# Patient Record
Sex: Male | Born: 1990 | Race: White | Hispanic: No | Marital: Single | Smoking: Never smoker
Health system: Southern US, Community
[De-identification: ages and names within clinical notes are randomized; demographics above are authoritative.]

---
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
## 2016-11-14 ENCOUNTER — Ambulatory Visit
Admission: EM | Admit: 2016-11-14 | Discharge: 2016-11-14 | Disposition: A | Discharge status: Home / Self Care | Payer: BC Managed Care – PPO | Attending: Emergency Medicine | Admitting: Emergency Medicine

## 2016-11-14 DIAGNOSIS — K047 Periapical abscess without sinus: Secondary | ICD-10-CM

## 2016-11-14 DIAGNOSIS — S025XXB Fracture of tooth (traumatic), initial encounter for open fracture: Secondary | ICD-10-CM | POA: Diagnosis not present

## 2016-11-14 MED ORDER — AMOXICILLIN-POT CLAVULANATE 875-125 MG PO TABS: 1.0000 | ORAL_TABLET | Freq: Two times a day (BID) | ORAL | 0 refills | Status: DC

## 2016-11-14 NOTE — ED Provider Notes (Signed)
CSN: [id number]     Arrival date & time 11/14/16  1345 History   First MD Initiated Contact with Patient 11/14/16 1521     Chief Complaint  Patient presents with  . Dental Pain   (Consider location/radiation/quality/duration/timing/severity/associated sxs/prior Treatment) HPI  26 year old male who presents with history of tooth pain and swollen jaw. He has a fractured tooth on the bottom left #19. His face is swollen on the left. There is no cervical adenopathy. He has a dental appointment next week but they will not do anything until the swelling is subsided and infection is under control. he does not require pain medications.       History reviewed. No pertinent past medical history. History reviewed. No pertinent surgical history. History reviewed. No pertinent family history. Social History  Substance Use Topics  . Smoking status: Never Smoker  . Smokeless tobacco: Never Used  . Alcohol use No    Review of Systems  Constitutional: Negative for activity change, chills, fatigue and fever.  HENT: Positive for dental problem.   All other systems reviewed and are negative.   Allergies  Patient has no known allergies.  Home Medications   Prior to Admission medications   Medication Sig Start Date End Date Taking? Authorizing Provider  amoxicillin-clavulanate (AUGMENTIN) 875-125 MG tablet Take 1 tablet by mouth every 12 (twelve) hours. 11/14/16   Lutricia Feil, PA-C   Meds Ordered and Administered this Visit  Medications - No data to display  BP (!) 147/77 (BP Location: Left Arm)   Pulse (!) 59   Temp 98.5 F (36.9 C) (Oral)   Resp 18   Ht 6' (1.829 m)   Wt 260 lb (117.9 kg)   SpO2 99%   BMI 35.26 kg/m  No data found.   Physical Exam  Constitutional: He is oriented to person, place, and time. He appears well-developed and well-nourished. No distress.  HENT:  Head: Normocephalic and atraumatic.  Examination shows the face to be swollen on the left side. No  induration or fluctuance present. #19 is fractured. The gums surrounding that area is swollen tender and erythematous.  No Purulent is seen.  Eyes: Pupils are equal, round, and reactive to light.  Neck: Normal range of motion. Neck supple.  Musculoskeletal: Normal range of motion.  Lymphadenopathy:    He has no cervical adenopathy.  Neurological: He is alert and oriented to person, place, and time.  Skin: Skin is warm and dry. He is not diaphoretic.  Psychiatric: He has a normal mood and affect. His behavior is normal. Judgment and thought content normal.  Nursing note and vitals reviewed.   Urgent Care Course     Procedures (including critical care time)  Labs Review Labs Reviewed - No data to display  Imaging Review No results found.   Visual Acuity Review  Right Eye Distance:   Left Eye Distance:   Bilateral Distance:    Right Eye Near:   Left Eye Near:    Bilateral Near:         MDM   1. Open fracture of tooth, initial encounter   2. Dental abscess    Discharge Medication List as of 11/14/2016  3:30 PM    START taking these medications   Details  amoxicillin-clavulanate (AUGMENTIN) 875-125 MG tablet Take 1 tablet by mouth every 12 (twelve) hours., Starting Sun 11/14/2016, Normal      Plan: 1. Test/x-ray results and diagnosis reviewed with patient 2. rx as per orders; risks, benefits, potential  side effects reviewed with patient 3. Recommend supportive treatment with warm Compresses to the jaw times daily for 10 minutes at a time.Follow up with his dentist next week. Use a combination of Motrin and Tylenol together every 6 hours for pain control 4. F/u prn if symptoms worsen or don't improve     Lutricia Feil, PA-C 11/14/16 1538    [address] [place], [place] 11/14/16 1538

## 2016-11-14 NOTE — Discharge Instructions (Signed)
Use ibuprofen 400 mg with Tylenol 500 mg every 6 hours as necessary for dental pain. Take both pills together at one time

## 2021-01-08 ENCOUNTER — Emergency Department
Admission: EM | Admit: 2021-01-08 | Discharge: 2021-01-08 | Disposition: A | Discharge status: Home / Self Care | Payer: Managed Care, Other (non HMO) | Attending: Emergency Medicine | Admitting: Emergency Medicine

## 2021-01-08 ENCOUNTER — Encounter: Payer: Self-pay | Admitting: Emergency Medicine

## 2021-01-08 ENCOUNTER — Emergency Department: Payer: Managed Care, Other (non HMO)

## 2021-01-08 ENCOUNTER — Other Ambulatory Visit: Payer: Self-pay

## 2021-01-08 DIAGNOSIS — Y9389 Activity, other specified: Secondary | ICD-10-CM | POA: Diagnosis not present

## 2021-01-08 DIAGNOSIS — Y990 Civilian activity done for income or pay: Secondary | ICD-10-CM | POA: Diagnosis not present

## 2021-01-08 DIAGNOSIS — S91312A Laceration without foreign body, left foot, initial encounter: Secondary | ICD-10-CM | POA: Diagnosis not present

## 2021-01-08 DIAGNOSIS — Z23 Encounter for immunization: Secondary | ICD-10-CM | POA: Diagnosis not present

## 2021-01-08 DIAGNOSIS — W208XXA Other cause of strike by thrown, projected or falling object, initial encounter: Secondary | ICD-10-CM | POA: Diagnosis not present

## 2021-01-08 DIAGNOSIS — S99922A Unspecified injury of left foot, initial encounter: Secondary | ICD-10-CM | POA: Diagnosis present

## 2021-01-08 DIAGNOSIS — Y9289 Other specified places as the place of occurrence of the external cause: Secondary | ICD-10-CM | POA: Insufficient documentation

## 2021-01-08 MED ORDER — CEPHALEXIN 500 MG PO CAPS: 1000.0000 mg | ORAL_CAPSULE | Freq: Two times a day (BID) | ORAL | 0 refills | Status: DC

## 2021-01-08 MED ORDER — TETANUS-DIPHTH-ACELL PERTUSSIS 5-2.5-18.5 LF-MCG/0.5 IM SUSY
0.5000 mL | PREFILLED_SYRINGE | Freq: Once | INTRAMUSCULAR | Status: AC
  Administered 2021-01-08: 0.5 mL via INTRAMUSCULAR
  Filled 2021-01-08: qty 0.5

## 2021-01-08 MED ORDER — LIDOCAINE HCL (PF) 1 % IJ SOLN
10.0000 mL | Freq: Once | INTRAMUSCULAR | Status: AC
  Administered 2021-01-08: 10 mL
  Filled 2021-01-08: qty 10

## 2021-01-08 NOTE — ED Provider Notes (Signed)
[facility] Emergency Department Provider Note  ____________________________________________  Time seen: Approximately 9:52 PM  I have reviewed the triage vital signs and the nursing notes.   HISTORY  Chief Complaint Laceration    HPI Jacob Zuniga is a 30 y.o. male who presents the emergency department for a laceration to the left foot.  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  Patient sustained a laceration through his tennis shoe and arrives with laceration to the dorsal aspect of the left foot.  No loss of range of motion.  Patient is still ambulatory at this time.  Last tetanus shot is roughly greater than 5 years ago.  No medications prior to arrival.  No other injury or complaint at this time.         History reviewed. No pertinent past medical history.  There are no problems to display for this patient.   History reviewed. No pertinent surgical history.  Prior to Admission medications   Medication Sig Start Date End Date Taking? Authorizing Provider  cephALEXin (KEFLEX) 500 MG capsule Take 2 capsules (1,000 mg total) by mouth 2 (two) times daily. 01/08/21  Yes Pharoah Goggins, Delorise Royals, PA-C  amoxicillin-clavulanate (AUGMENTIN) 875-125 MG tablet Take 1 tablet by mouth every 12 (twelve) hours. 11/14/16   Lutricia Feil, PA-C    Allergies Patient has no known allergies.  No family history on file.  Social History Social History   Tobacco Use  . Smoking status: Never Smoker  . Smokeless tobacco: Never Used  Substance Use Topics  . Alcohol use: No  . Drug use: No     Review of Systems  Constitutional: No fever/chills Eyes: No visual changes. No discharge ENT: No upper respiratory complaints. Cardiovascular: no chest pain. Respiratory: no cough. No SOB. Gastrointestinal: No abdominal pain.  No nausea, no vomiting.  No  diarrhea.  No constipation. Musculoskeletal: Positive for laceration to the left foot Skin: Negative for rash, abrasions, lacerations, ecchymosis. Neurological: Negative for headaches, focal weakness or numbness.  10 System ROS otherwise negative.  ____________________________________________   PHYSICAL EXAM:  VITAL SIGNS: ED Triage Vitals [01/08/21 2024]  Enc Vitals Group     BP (!) 147/109     Pulse Rate 81     Resp 20     Temp 98.4 F (36.9 C)     Temp Source Oral     SpO2 97 %     Weight 259 lb 14.8 oz (117.9 kg)     Height 6' (1.829 m)     Head Circumference      Peak Flow      Pain Score 8     Pain Loc      Pain Edu?      Excl. in GC?      Constitutional: Alert and oriented. Well appearing and in no acute distress. Eyes: Conjunctivae are normal. PERRL. EOMI. Head: Atraumatic. ENT:      Ears:       Nose: No congestion/rhinnorhea.      Mouth/Throat: Mucous membranes are moist.  Neck: No stridor.    Cardiovascular: Normal rate, regular rhythm. Normal S1 and S2.  Good peripheral circulation. Respiratory: Normal respiratory effort without tachypnea or retractions. Lungs CTAB. Good air entry to the bases with no decreased or absent breath sounds. Musculoskeletal: Full range of motion to all extremities. No gross deformities appreciated.  Visualization of the left foot reveals a "L" shaped laceration measuring approximately 2.5 cm in length.  Patient with minimal amount of bleeding at this time.  No visible foreign body.  Full range of motion to the foot and all digits.  Sensation intact all digits.  Capillary refill less than 2 seconds all digits.  This occurs over the midfoot. Neurologic:  Normal speech and language. No gross focal neurologic deficits are appreciated.  Skin:  Skin is warm, dry and intact. No rash noted. Psychiatric: Mood and affect are normal. Speech and behavior are normal. Patient exhibits appropriate insight and  judgement.   ____________________________________________   LABS (all labs ordered are listed, but only abnormal results are displayed)  Labs Reviewed - No data to display ____________________________________________  EKG   ____________________________________________  RADIOLOGY I personally viewed and evaluated these images as part of my medical decision making, as well as reviewing the written report by the radiologist.  ED Provider Interpretation: No evidence of osseous trauma or retained foreign body  DG Foot Complete Left  Result Date: 01/08/2021 CLINICAL DATA:  Dropped heavy object on foot, laceration EXAM: LEFT FOOT - COMPLETE 3+ VIEW COMPARISON:  None. FINDINGS: Frontal, oblique, and lateral views of the left foot are obtained. No acute fracture, subluxation, or dislocation. Joint spaces are well preserved. There is mild dorsal soft tissue swelling of the forefoot. No radiopaque foreign body. IMPRESSION: 1. Dorsal soft tissue swelling of the forefoot. No underlying fracture. Electronically Signed   By: Sharlet Salina M.D.   On: 01/08/2021 20:56    ____________________________________________    PROCEDURES  Procedure(s) performed:    Marland KitchenMarland KitchenLaceration Repair  Date/Time: 01/08/2021 10:58 PM Performed by: Racheal Patches, PA-C Authorized by: Racheal Patches, PA-C   Consent:    Consent obtained:  Verbal   Consent given by:  Patient   Risks discussed:  Infection, pain and poor wound healing Universal protocol:    Procedure explained and questions answered to patient or proxy's satisfaction: yes     Immediately prior to procedure, a time out was called: yes     Patient identity confirmed:  Verbally with patient Anesthesia:    Anesthesia method:  Local infiltration   Local anesthetic:  Lidocaine 1% w/o epi Laceration details:    Location:  Foot   Foot location:  Top of L foot   Length (cm):  2.5 Pre-procedure details:    Preparation:  Patient was prepped  and draped in usual sterile fashion and imaging obtained to evaluate for foreign bodies Exploration:    Hemostasis achieved with:  Direct pressure   Imaging obtained: x-ray     Imaging outcome: foreign body not noted     Wound exploration: wound explored through full range of motion and entire depth of wound visualized     Wound extent: no foreign bodies/material noted, no nerve damage noted, no tendon damage noted, no underlying fracture noted and no vascular damage noted     Contaminated: no   Treatment:    Area cleansed with:  Povidone-iodine and saline   Amount of cleaning:  Standard   Irrigation solution:  Sterile saline   Irrigation volume:    Irrigation method:  Syringe Skin repair:    Repair method:  Sutures   Suture size:  4-0   Suture material:  Nylon   Suture technique:  Simple interrupted   Number of sutures:  5 Approximation:    Approximation:  Close Repair type:    Repair type:  Simple Post-procedure details:    Dressing:  Sterile dressing   Procedure completion:  Tolerated well, no immediate complications      Medications  lidocaine (PF) (XYLOCAINE) 1 % injection 10 mL (has no administration in time range)  Tdap (BOOSTRIX) injection 0.5 mL (has no administration in time range)     ____________________________________________   INITIAL IMPRESSION / ASSESSMENT AND PLAN / ED COURSE  Pertinent labs & imaging results that were available during my care of the patient were reviewed by me and considered in my medical decision making (see chart for details).  Review of the [place] CSRS was performed in accordance of the [facility] prior to dispensing any controlled drugs.           Patient's diagnosis is consistent with foot laceration.  Patient presented to the emergency department complaining of a laceration to the left foot.  Patient dropped a large compressor onto his foot and sustained a laceration through the top of his shoe and foot.  No visible foreign  body on exam.  No radiopaque foreign body on imaging.  No underlying osseous trauma.  Full range of motion, sensation and capillary refill to the digits of the left foot.  Laceration is closed as described above.  Patient tolerated well with no complications.  Wound care instructions discussed with the patient.  Patiently placed on antibiotics prophylactically.  Tetanus shot updated tonight.  Return to urgent care, primary care in 7 to 10 days for suture removal. Patient is given ED precautions to return to the ED for any worsening or new symptoms.     ____________________________________________  FINAL CLINICAL IMPRESSION(S) / ED DIAGNOSES  Final diagnoses:  Laceration of left foot, initial encounter      NEW MEDICATIONS STARTED DURING THIS VISIT:  ED Discharge Orders         Ordered    cephALEXin (KEFLEX) 500 MG capsule  2 times daily        01/08/21 2301              This chart was dictated using voice recognition software/Dragon. Despite best efforts to proofread, errors can occur which can change the meaning. Any change was purely unintentional.    Racheal Patches, PA-C 01/08/21 2301    Delton Prairie, MD 01/08/21 [phone]

## 2021-01-08 NOTE — ED Triage Notes (Signed)
FIRST NURSE NOTE:  Pt here with L foot injury after dropping a compressor on L foot.

## 2021-01-08 NOTE — ED Triage Notes (Signed)
Pt reports that he was moving stuff and dropped something heavy on his left foot and has a laceration on the top of his foot. Bleeding is under control.

## 2021-09-12 IMAGING — DX DG FOOT COMPLETE 3+V*L*
3 series · 3 of 3 positions shown · non-contrast
Comparison: None.

CLINICAL DATA: Dropped heavy object on foot, laceration

EXAM:
LEFT FOOT - COMPLETE 3+ VIEW

[foot ap]
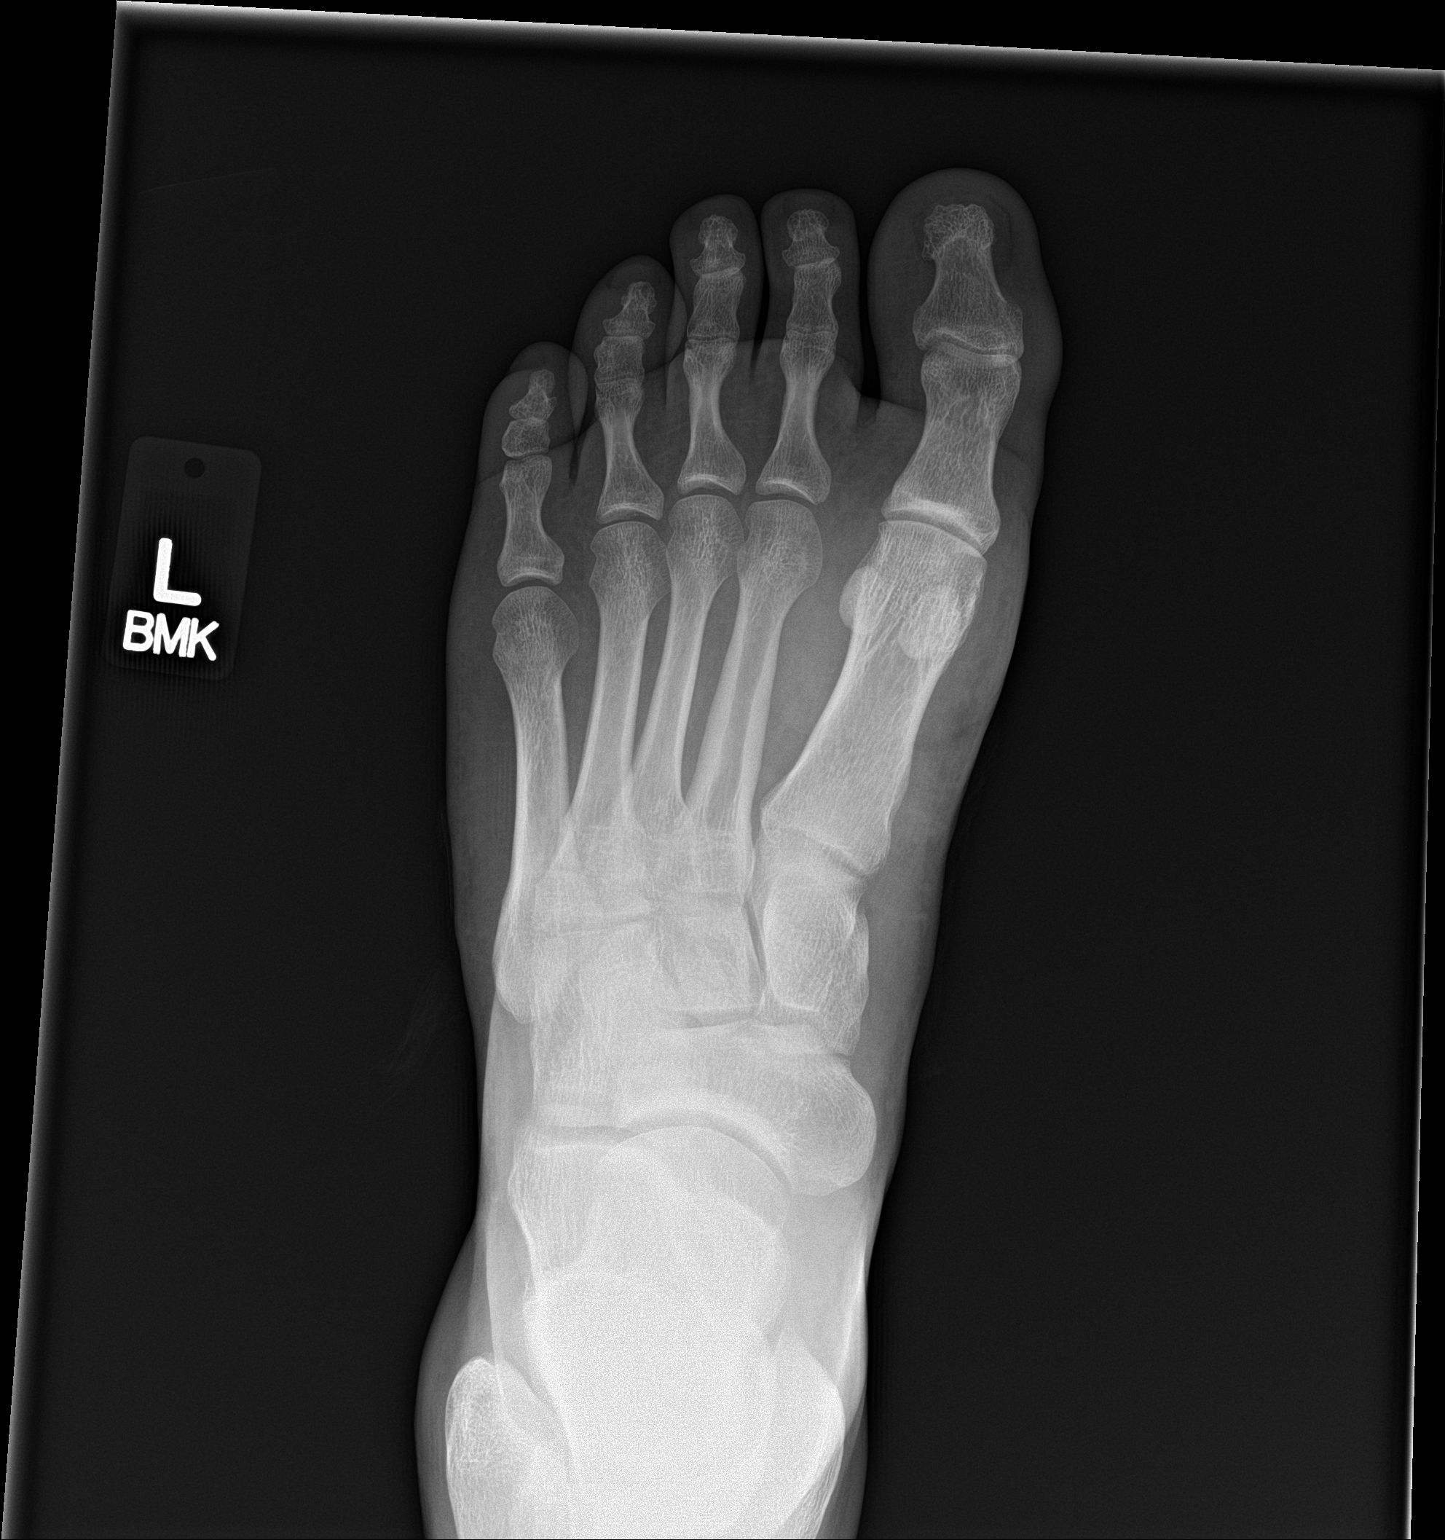

[foot obl]
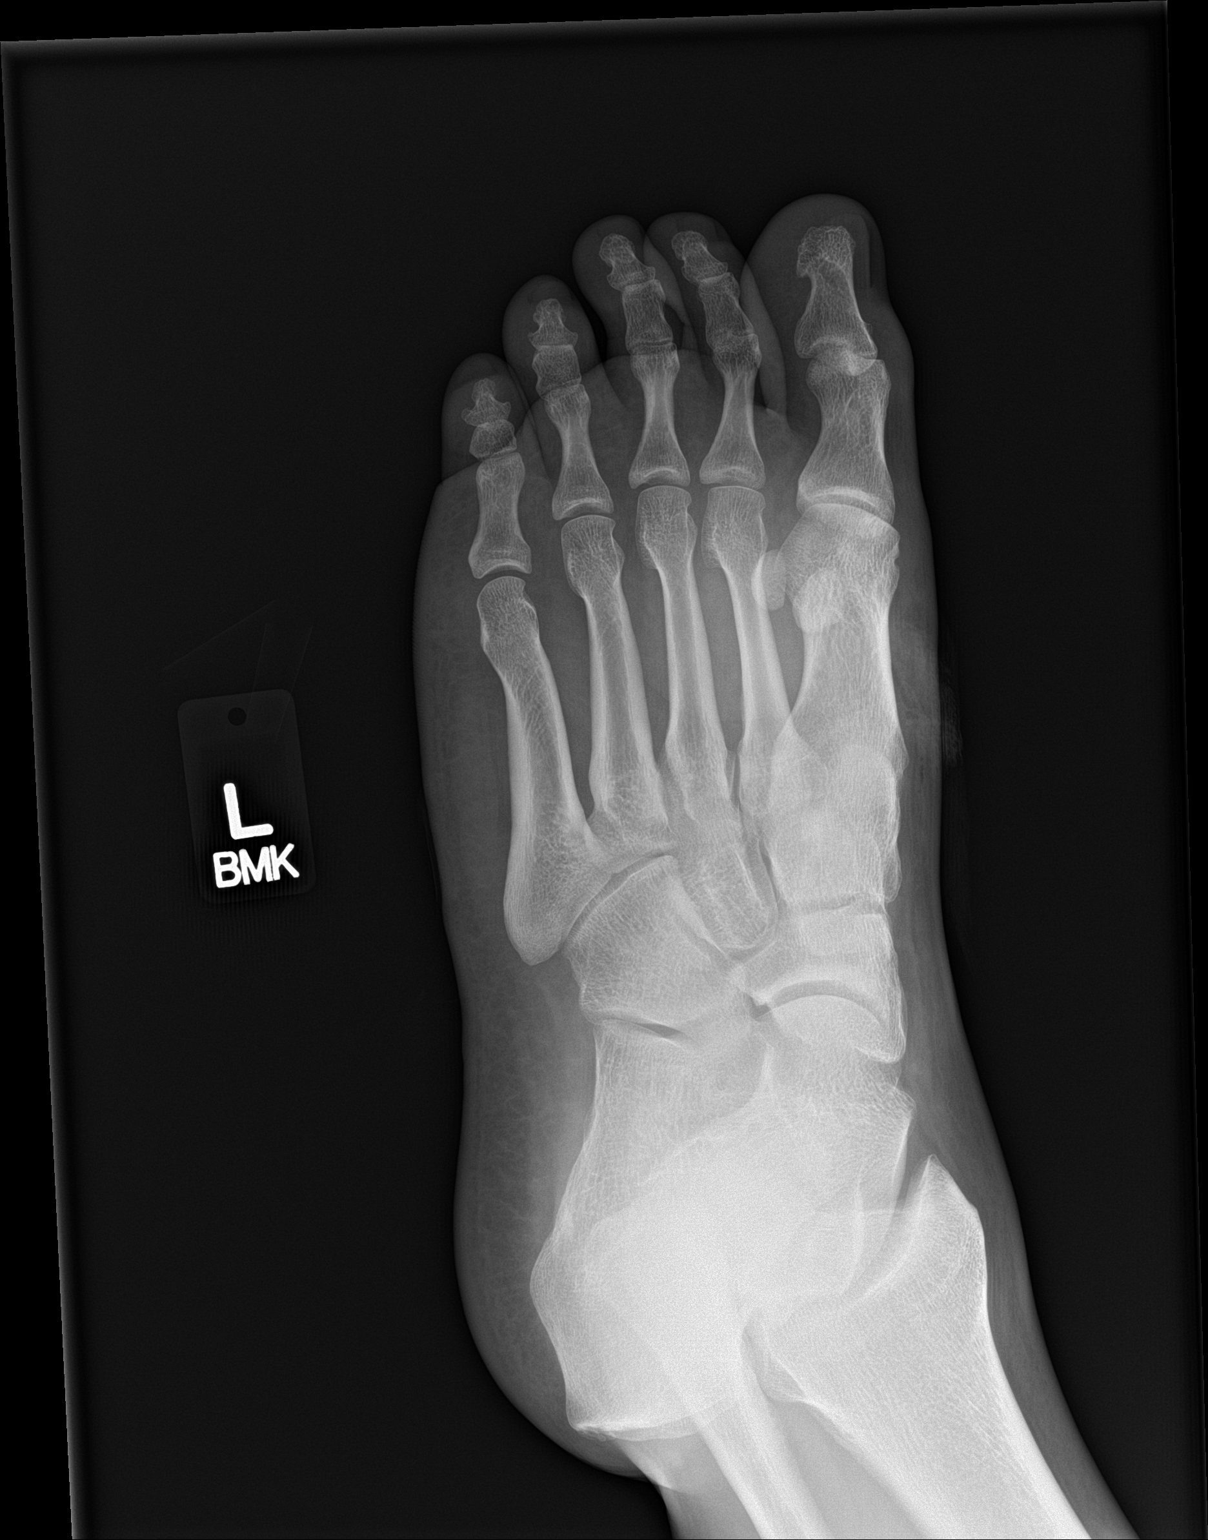

[foot lat]
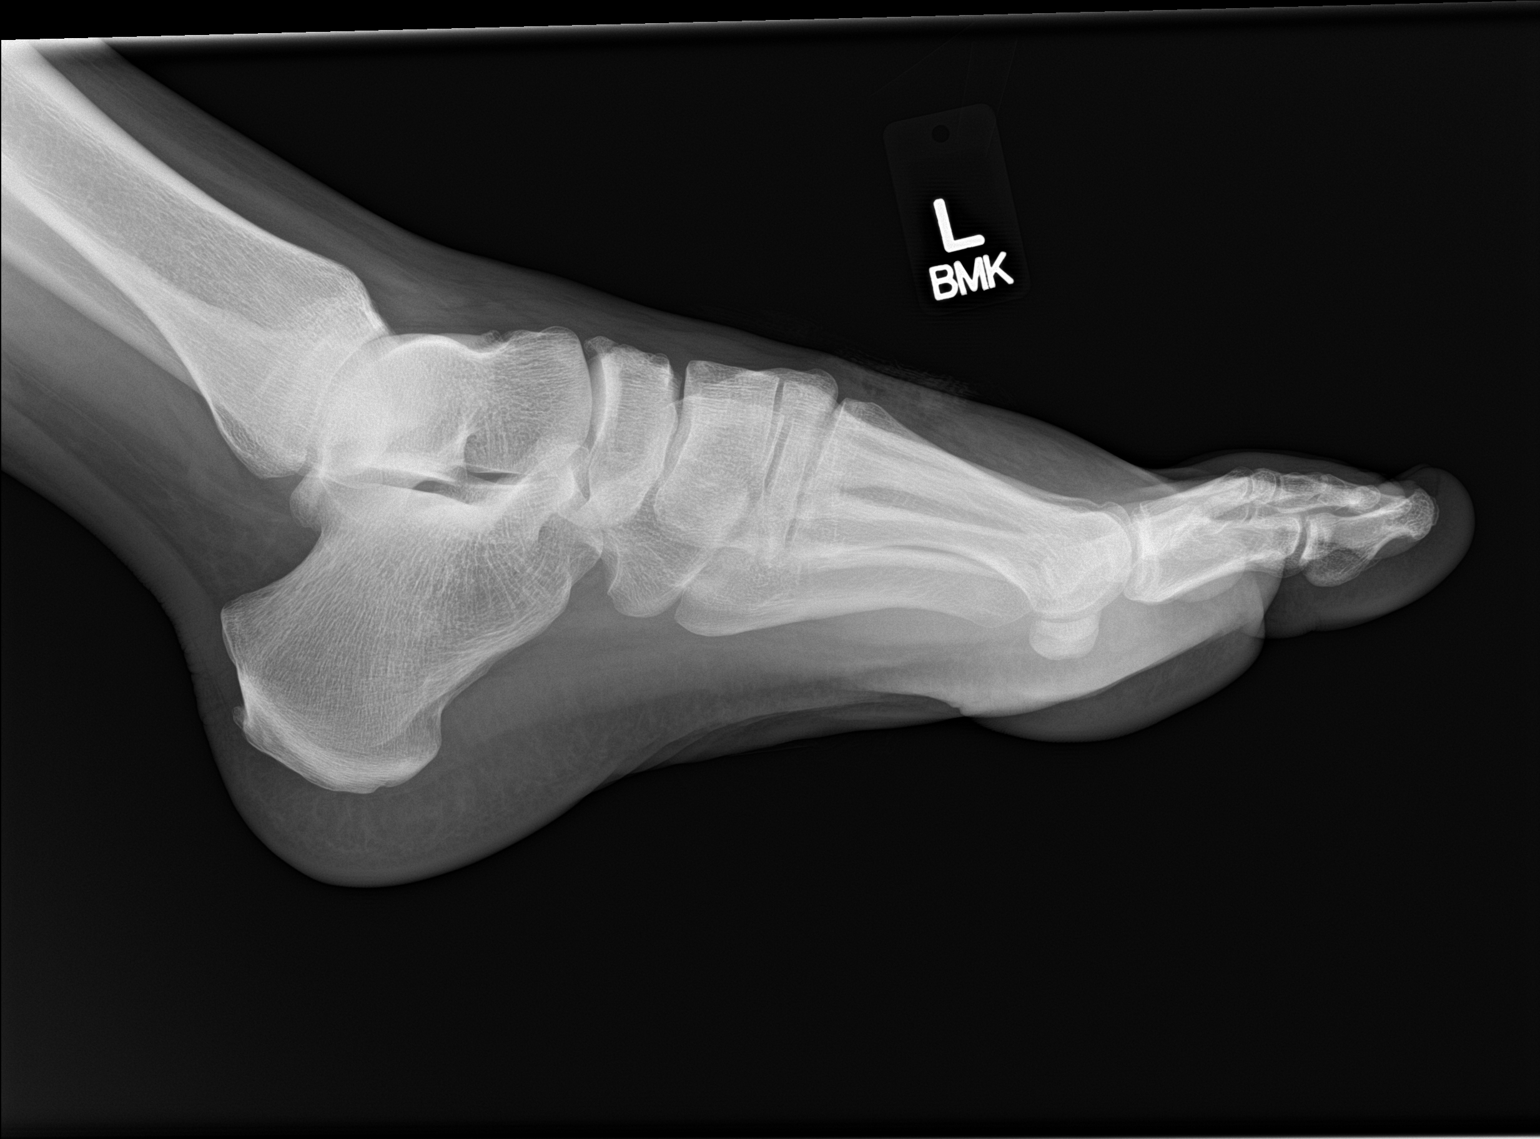

[3 of 3 positions shown; findings below may reference images not displayed]

FINDINGS: Frontal, oblique, and lateral views of the left foot are obtained.
No acute fracture, subluxation, or dislocation. Joint spaces are
well preserved. There is mild dorsal soft tissue swelling of the
forefoot. No radiopaque foreign body.
IMPRESSION: 1. Dorsal soft tissue swelling of the forefoot. No underlying
fracture.

## 2024-04-29 ENCOUNTER — Emergency Department
Admission: EM | Admit: 2024-04-29 | Discharge: 2024-04-29 | Disposition: A | Discharge status: Home / Self Care | Attending: Emergency Medicine | Admitting: Emergency Medicine

## 2024-04-29 ENCOUNTER — Emergency Department

## 2024-04-29 ENCOUNTER — Other Ambulatory Visit: Payer: Self-pay

## 2024-04-29 DIAGNOSIS — R002 Palpitations: Secondary | ICD-10-CM | POA: Diagnosis not present

## 2024-04-29 DIAGNOSIS — R079 Chest pain, unspecified: Secondary | ICD-10-CM | POA: Diagnosis present

## 2024-04-29 LAB — BASIC METABOLIC PANEL WITH GFR
Anion gap: 10 (ref 5–15)
BUN: 11 mg/dL (ref 6–20)
CO2: 25 mmol/L (ref 22–32)
Calcium: 8.9 mg/dL (ref 8.9–10.3)
Chloride: 103 mmol/L (ref 98–111)
Creatinine, Ser: 0.84 mg/dL (ref 0.61–1.24)
GFR, Estimated: >60 mL/min (ref >60)
Glucose, Bld: 143 mg/dL — ABNORMAL HIGH (ref 70–99)
Potassium: 3.6 mmol/L (ref 3.5–5.1)
Sodium: 138 mmol/L (ref 135–145)

## 2024-04-29 LAB — CBC
HCT: 35.3 % — ABNORMAL LOW (ref 39.0–52.0)
Hemoglobin: 10.7 g/dL — ABNORMAL LOW (ref 13.0–17.0)
MCH: 23 pg — ABNORMAL LOW (ref 26.0–34.0)
MCHC: 30.3 g/dL (ref 30.0–36.0)
MCV: 75.8 fL — ABNORMAL LOW (ref 80.0–100.0)
Platelets: 315 K/uL (ref 150–400)
RBC: 4.66 MIL/uL (ref 4.22–5.81)
RDW: 15.5 % (ref 11.5–15.5)
WBC: 5.6 K/uL (ref 4.0–10.5)
nRBC: 0 % (ref 0.0–0.2)

## 2024-04-29 LAB — TROPONIN I (HIGH SENSITIVITY): Troponin I (High Sensitivity): 3 ng/L (ref <18)

## 2024-04-29 NOTE — ED Triage Notes (Signed)
 Pt to ED for heart 'beating hard' and chest tightness since 0300 today (it woke him up, then he fell back asleep).   Respirations unlabored, skin dry.

## 2024-04-29 NOTE — ED Provider Notes (Signed)
 [facility] Provider Note   Event Date/Time   First MD Initiated Contact with Patient 04/29/24 1154     (approximate) History  Chest Pain  HPI Jacob Zuniga is a 33 y.o. male with no stated past medical history who presents complaining of palpitations and chest tightness that began at approximately 0300 this morning.  Patient states that it woke him from sleep however he was able to get back to sleep later.  Patient states that he has had intermittent palpitations since waking up this morning however states that the chest tightness has improved however is still there.  Patient denies any exacerbating or relieving factors for this pain.  Patient does state that he has soreness on the left side of his pectoral muscle where he is torn in the past.  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  Patient believes that this may be contributing to this pain on the left side of his chest ROS: Patient currently denies any vision changes, tinnitus, difficulty speaking, facial droop, sore throat, shortness of breath, abdominal pain, nausea/vomiting/diarrhea, dysuria, or weakness/numbness/paresthesias in any extremity   Physical Exam  Triage Vital Signs: ED Triage Vitals  Encounter Vitals Group     BP 04/29/24 1124 (!) 148/97     Girls Systolic BP Percentile --      Girls Diastolic BP Percentile --      Boys Systolic BP Percentile --      Boys Diastolic BP Percentile --      Pulse Rate 04/29/24 1124 68     Resp 04/29/24 1122 20     Temp 04/29/24 1124 98.4 F (36.9 C)     Temp Source 04/29/24 1124 Oral     SpO2 04/29/24 1124 100 %     Weight 04/29/24 1123 295 lb (133.8 kg)     Height 04/29/24 1123 6' (1.829 m)     Head Circumference --      Peak Flow --      Pain Score 04/29/24 1122 3     Pain Loc --      Pain Education --      Exclude from Growth Chart --    Most recent vital  signs: Vitals:   04/29/24 1122 04/29/24 1124  BP:  (!) 148/97  Pulse:  68  Resp: 20   Temp:  98.4 F (36.9 C)  SpO2:  100%   General: Awake, oriented x4. CV:  Good peripheral perfusion.  No MGR Resp:  Normal effort.  CTAB Abd:  No distention. Other:  Middle-aged oh obese Caucasian male resting comfortably in no acute distress.  Tenderness to palpation over the inferior left lateral pectoral muscle ED Results / Procedures / Treatments  Labs (all labs ordered are listed, but only abnormal results are displayed) Labs Reviewed  BASIC METABOLIC PANEL WITH GFR - Abnormal; Notable for the following components:      Result Value   Glucose, Bld 143 (*)    All other components within normal limits  CBC - Abnormal; Notable for the following components:   Hemoglobin 10.7 (*)    HCT 35.3 (*)    MCV 75.8 (*)    MCH 23.0 (*)    All other components within normal limits  TROPONIN I (HIGH SENSITIVITY)   EKG ED ECG REPORT I, Artist MARLA Kerns, the attending physician, personally viewed and interpreted this ECG. Date: 04/29/2024 EKG Time:  1126 Rate: 67 Rhythm: normal sinus rhythm QRS Axis: normal Intervals: normal ST/T Wave abnormalities: normal Narrative Interpretation: no evidence of acute ischemia RADIOLOGY ED MD interpretation: 2 view chest x-ray interpreted by me shows no evidence of acute abnormalities including no pneumonia, pneumothorax, or widened mediastinum - All radiology independently interpreted and agree with radiology assessment Official radiology report(s): DG Chest 2 View Result Date: 04/29/2024 CLINICAL DATA:  Chest pain today. EXAM: CHEST - 2 VIEW COMPARISON:  None Available. FINDINGS: The heart size and mediastinal contours are within normal limits. Both lungs are clear. The visualized skeletal structures are unremarkable. IMPRESSION: No active cardiopulmonary disease. Electronically Signed   By: Toribio Agreste M.D.   On: 04/29/2024 11:58   PROCEDURES: Critical Care  performed: No Procedures MEDICATIONS ORDERED IN ED: Medications - No data to display IMPRESSION / MDM / ASSESSMENT AND PLAN / ED COURSE  I reviewed the triage vital signs and the nursing notes.                             The patient is on the cardiac monitor to evaluate for evidence of arrhythmia and/or significant heart rate changes. Patient's presentation is most consistent with acute presentation with potential threat to life or bodily function. Patient is a 33 year old male with no stated past medical history presents complaining of palpitations and chest pain that woke him from sleep at 3 AM.   DDx: Arrhythmia, ACS, pericarditis, myocarditis, pneumothorax, pneumonia Plan: BMP, CBC, and troponin Chest x-ray EKG  Dispo: Discharge home with cardiology and PCP follow-up Clinical Course as of 04/29/24 1243  Sun Apr 29, 2024  1240 I spoke to patient at length about the results of his laboratory and radiologic evaluation.  Patient shows no murmurs on exam however does have palpable tenderness over the lateral chest.  Patient shows no arrhythmia on EKG.  Patient's troponin negative x 1 and as symptoms started at 3 AM I would expect this to be higher with any ischemia.  Patient has a plan for discharge at this time with over-the-counter analgesia as well as follow-up with cardiology (referral placed) for possible heart monitoring if symptoms do not improve.  Patient also states that he does not have a primary care physician and requests resources.  Referral placed for PCP as well.  Patient given strict return precautions and all questions answered prior to discharge [EB]    Clinical Course User Index [EB] Jossie, Jamila Slatten K, MD   FINAL CLINICAL IMPRESSION(S) / ED DIAGNOSES   Final diagnoses:  Chest pain, unspecified type  Palpitations   Rx / DC Orders   ED Discharge Orders          Ordered    Ambulatory referral to Cardiology       Comments: If you have not heard from the Cardiology  office within the next 72 hours please call [phone].   04/29/24 1237    Ambulatory Referral to Primary Care (Establish Care)        04/29/24 1237           Note:  This document was prepared using Dragon voice recognition software and may include unintentional dictation errors.   Jossie Artist POUR, MD 04/29/24 [phone]

## 2024-04-29 NOTE — Discharge Instructions (Addendum)
 Please recheck your blood pressure 1-3 times throughout this next week.  If your blood pressure is consistently above 150 on the top number (systolic) and/or above 90 on the bottom number (diastolic), please follow-up with primary care physicians as they may need to start you on antihypertensive medications for blood pressure

## 2024-04-29 NOTE — ED Notes (Signed)
 See triage note  Presents with some chest discomfort  States he was awakened with some rapid heart rate around 3 am  Was able to go to sleep   Then states he still was having some chest discomfort this am  Heart rate regular at present   No SOB or fever

## 2024-05-08 NOTE — Progress Notes (Deleted)
  Cardiology Office Note   Date:  05/08/2024  ID:  Jacob Zuniga, DOB October 10, 1990, MRN [id number] PCP: Patient, No Pcp Per  [facility] Providers Cardiologist:  None { Click to update primary MD,subspecialty MD or APP then REFRESH:1}    History of Present Illness Jacob Zuniga is a 33 y.o. male with no significant past medical history is being seen today to establish care after recent visit to the emergency department with complaints of palpitations and chest tightness.  Presented to [facility] emergency department on 04/30/2019 for follow-up of complaint of palpitations and chest tightness that began approximately 3 in the morning.  Patient states that it woke him from sleep however he was able to get back to sleep later on.  He states that he had had intermittent palpitations since waking up in the morning however the chest tightness that improved over time.  He denied any aggravating or alleviating factors for his pain.  He stated that it was a soreness on the left side of his chest and his pectoral muscle where he had had a torn muscle in the past.  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  He believes the discomfort was not related to work and believes that may be contributing to the pain on the left side of his chest.  Blood pressure was suboptimally controlled at 148/97.  Pulse of 60, respirations of 20, temperature 98.4.  Labs reveal hemoglobin of 10.7 high-sensitivity troponin negative x 1.  With him having palpable tenderness of the lateral chest, EKG showed no arrhythmia, negative high-sensitivity troponin, plan was for discharge from the emergency department with over-the-counter analgesics and outpatient referral to cardiology.  He presents to clinic today   ROS: 10 point review of system has been reviewed and considered negative except ones were listed in the HPI  Studies Reviewed      Risk Assessment/Calculations   No BP recorded.   {Refresh Note OR Click here to enter BP  :1}***       Physical Exam VS:  There were no vitals taken for this visit.       Wt Readings from Last 3 Encounters:  04/29/24 294 lb 15.6 oz (133.8 kg)  01/08/21 259 lb 14.8 oz (117.9 kg)  11/14/16 260 lb (117.9 kg)    GEN: Well nourished, well developed in no acute distress NECK: No JVD; No carotid bruits CARDIAC: ***RRR, no murmurs, rubs, gallops RESPIRATORY:  Clear to auscultation without rales, wheezing or rhonchi  ABDOMEN: Soft, non-tender, non-distended EXTREMITIES:  No edema; No deformity   ASSESSMENT AND PLAN Atypical chest pain Palpitations abnormal    {Are you ordering a CV Procedure (e.g. stress test, cath, DCCV, TEE, etc)?   Press F2        :[id number]}  Dispo: ***  Signed, Brentyn Seehafer, NP

## 2024-05-09 ENCOUNTER — Ambulatory Visit: Attending: Cardiology | Admitting: Cardiology

## 2024-05-24 ENCOUNTER — Encounter: Payer: Self-pay | Admitting: Emergency Medicine

## 2024-06-08 ENCOUNTER — Ambulatory Visit: Payer: Self-pay

## 2024-06-08 DIAGNOSIS — K130 Diseases of lips: Secondary | ICD-10-CM

## 2024-06-08 NOTE — Progress Notes (Signed)
 [facility] Department STI clinic [address] [place] [address] Main phone: [phone]  STI visit  Subjective:  Jacob Zuniga is a 33 y.o. male being seen today for evaluation of oral lesions.  Patient has the following medical conditions:  There are no active problems to display for this patient.  Chief Complaint  Patient presents with   SEXUALLY TRANSMITTED DISEASE   HPI Patient reports 3 weeks ago he kissed a woman who then let him know she has a history of oral HSV-1 and takes medication when she has symptoms. He then developed a rash on his lips 2 weeks ago. The rash is very small (barely noticeable) raised bumps along his lips.  This is his only concern and he does not want any screening tests.  See flowsheet for further details and programmatic requirements  Hyperlink available at the top of the signed note in blue.  Flow sheet content below:  Pregnancy Intention Screening Does the patient want to become pregnant in the next year?: N/A Does the patient's partner want to become pregnant in the next year?: No Would the patient like to discuss contraceptive options today?: N/A Counseling Patient counseled to use condoms with all sex: Condoms given RTC in 2-3 weeks for test results: Yes Clinic will call if test results abnormal before test result appt.: Yes Test results given to patient Patient counseled to use condoms with all sex: Condoms given  Screening for MPX risk:  Unexplained rash?  No   MSM?  No   Multiple or anonymous sex partners?  No   Any close or sexual contact with a person  diagnosed with MPX?  No   Any outside the [place]  where MPX is endemic?  No   High clinical suspicion for MPX?    -Unlikely to be chickenpox    -Lymphadenopathy    -Rash that presents in same phase of       evolution on any given body part  No   STI screening history: Last HIV test per patient/review of record was No results found for:  HMHIVSCREEN No results found for: HIV  Last HEPC test per patient/review of record was No results found for: HMHEPCSCREEN No components found for: HEPC   Last HEPB test per patient/review of record was No components found for: HMHEPBSCREEN   Fertility: Does the patient or their partner desires a pregnancy in the next year? No  Immunization History  Administered Date(s) Administered   Tdap 01/08/2021    The following portions of the patient's history were reviewed and updated as appropriate: allergies, current medications, past medical history, past social history, past surgical history and problem list.  Objective:  There were no vitals filed for this visit.  Physical Exam Constitutional:      Appearance: Normal appearance.  HENT:     Head: Normocephalic.     Mouth/Throat:     Lips: Pink.     Mouth: Mucous membranes are moist.     Comments: Very small, barely perceptible raised bumps of perioral region that are similar in color to his lips. They are NOT vesicular, non tender, and no drainage, crusting or scabbing.  Eyes:     General: No scleral icterus.       Right eye: No discharge.        Left eye: No discharge.  Pulmonary:     Effort: Pulmonary effort is normal.  Skin:    General: Skin is warm and dry.  Neurological:  General: No focal deficit present.     Mental Status: He is alert.  Psychiatric:        Mood and Affect: Mood normal.        Behavior: Behavior normal.    Assessment and Plan:  Jacob Zuniga is a 33 y.o. male presenting to the [facility] Department for STI screening  1. Lesion of lip (Primary)  - Very small, barely perceptible raised bumps of perioral region that are similar in color to his lips. They are NOT vesicular, non tender, and no drainage, crusting or scabbing. - Not consistent with HSV or syphilis  - Likely perioral irritation OR normal perioral tissue he has become concerned about/has noticed due to being  notified of HSV-1 exposure via kissing - I am not concerned about any infectious origin of this presentation - provided reassurance and discussed HSV presentation and transmission  No screening today/patients declines all tests. Recommended condom use with all sex Discussed importance of condom use for STI prevention Recommended returning for continued or worsening symptoms.   Return if symptoms worsen or fail to improve.  No future appointments.  Jacob FORBES Satchel, NP
